# Patient Record
Sex: Male | Born: 1960 | Race: Black or African American | Hispanic: No | Marital: Married | Smoking: Never smoker
Health system: Southern US, Community
[De-identification: ages and names within clinical notes are randomized; demographics above are authoritative.]

## PROBLEM LIST (undated history)

## (undated) ENCOUNTER — Ambulatory Visit: Admission: EM | Payer: PRIVATE HEALTH INSURANCE | Source: Home / Self Care

## (undated) DIAGNOSIS — E119 Type 2 diabetes mellitus without complications: Secondary | ICD-10-CM

## (undated) DIAGNOSIS — I10 Essential (primary) hypertension: Secondary | ICD-10-CM

## (undated) HISTORY — PX: ROTATOR CUFF REPAIR: SHX139

---

## 2008-11-28 ENCOUNTER — Ambulatory Visit: Payer: Self-pay | Admitting: Unknown Physician Specialty

## 2008-12-12 ENCOUNTER — Ambulatory Visit: Payer: Self-pay | Admitting: Unknown Physician Specialty

## 2013-03-01 ENCOUNTER — Ambulatory Visit: Payer: Self-pay | Admitting: Gastroenterology

## 2013-04-16 ENCOUNTER — Ambulatory Visit: Payer: Self-pay | Admitting: Physician Assistant

## 2013-09-25 ENCOUNTER — Emergency Department: Payer: Self-pay | Admitting: Emergency Medicine

## 2013-11-16 ENCOUNTER — Ambulatory Visit: Payer: Self-pay | Admitting: Emergency Medicine

## 2013-11-18 ENCOUNTER — Ambulatory Visit: Payer: Self-pay | Admitting: Emergency Medicine

## 2018-01-28 ENCOUNTER — Other Ambulatory Visit: Payer: Self-pay

## 2018-01-28 ENCOUNTER — Ambulatory Visit
Admission: EM | Admit: 2018-01-28 | Discharge: 2018-01-28 | Disposition: A | Discharge status: Home / Self Care | Payer: BLUE CROSS/BLUE SHIELD | Attending: Family Medicine | Admitting: Family Medicine

## 2018-01-28 ENCOUNTER — Encounter: Payer: Self-pay | Admitting: Emergency Medicine

## 2018-01-28 ENCOUNTER — Ambulatory Visit (INDEPENDENT_AMBULATORY_CARE_PROVIDER_SITE_OTHER): Payer: BLUE CROSS/BLUE SHIELD

## 2018-01-28 DIAGNOSIS — M6740 Ganglion, unspecified site: Secondary | ICD-10-CM | POA: Insufficient documentation

## 2018-01-28 DIAGNOSIS — G5601 Carpal tunnel syndrome, right upper limb: Secondary | ICD-10-CM | POA: Insufficient documentation

## 2018-01-28 DIAGNOSIS — M67441 Ganglion, right hand: Secondary | ICD-10-CM | POA: Diagnosis not present

## 2018-01-28 DIAGNOSIS — G5610 Other lesions of median nerve, unspecified upper limb: Secondary | ICD-10-CM

## 2018-01-28 HISTORY — DX: Type 2 diabetes mellitus without complications: E11.9

## 2018-01-28 HISTORY — DX: Essential (primary) hypertension: I10

## 2018-01-28 MED ORDER — MELOXICAM 15 MG PO TABS: 15.0000 mg | ORAL_TABLET | Freq: Every day | ORAL | 0 refills | Status: DC | PRN

## 2018-01-28 NOTE — Discharge Instructions (Signed)
Get yourself a wrist splint over the counter.  Medication as directed.  If persists, you will need an EMG and possibly an injection or surgery. I recommend seeing [facility].  Take care  Dr. Adriana Simas

## 2018-01-28 NOTE — ED Triage Notes (Signed)
Patient c/o knot in his right hand that started 3 months ago. He is now experiencing numbness and burning sensation in his right hand.

## 2018-01-28 NOTE — ED Provider Notes (Signed)
[facility]    CSN: [id number] Arrival date & time: 01/28/18  0950  History   Chief Complaint Chief Complaint  Patient presents with  . Hand Pain   HPI  58 year old male presents with the above complaint.  Patient reports a 63-month history of pain in his right hand.  Patient notes that he has had a raised area on the middle of his hand.  Firm.  It does not particularly hurt.  He is now experiencing numbness and a burning/tingling sensation of his index and middle finger.  Patient is a Naval architect.  No medications or interventions tried.  No known exacerbating or relieving factors.  No other associated symptoms.  No other complaints.  History reviewed and updated as below.  Past Medical History:  Diagnosis Date  . Diabetes mellitus without complication (HCC)   . Hypertension   HLD  Surgical Hx: Rotator cuff surgery  Right    COLONOSCOPY 03/01/2013  colon (entire examined portion) appeared normal; small nonbleeding intermal hemorrhoids during anoscopy     Home Medications    Prior to Admission medications   Medication Sig Start Date End Date Taking? Authorizing Provider  glipiZIDE (GLUCOTROL XL) 5 MG 24 hr tablet TAKE 1 TABLET BY MOUTH EVERY DAY 10/14/17  Yes [provider]  glucose blood (PRECISION QID TEST) test strip Use 2 (two) times daily 02/04/17  Yes [provider]  lisinopril (PRINIVIL,ZESTRIL) 10 MG tablet Take by mouth.   Yes [provider]  omeprazole (PRILOSEC OTC) 20 MG tablet Take by mouth.   Yes [provider]  pravastatin (PRAVACHOL) 20 MG tablet TAKE 1 TABLET BY MOUTH EVERY DAY AT NIGHT 11/10/17  Yes [provider]  meloxicam (MOBIC) 15 MG tablet Take 1 tablet (15 mg total) by mouth daily as needed. 01/28/18   Tommie Sams, DO   Social History Social History   Tobacco Use  . Smoking status: Never Smoker  . Smokeless tobacco: Never Used  Substance Use Topics  . Alcohol use: Never   Frequency: Never  . Drug use: Never    Allergies   Patient has no known allergies.   Review of Systems Review of Systems  Musculoskeletal:       Hand pain.  Neurological:       Hand paresthesia/burning.   Physical Exam Triage Vital Signs ED Triage Vitals  Enc Vitals Group     BP 01/28/18 1011 (!) 136/93     Pulse Rate 01/28/18 1011 73     Resp 01/28/18 1011 18     Temp 01/28/18 1011 98.2 F (36.8 C)     Temp Source 01/28/18 1011 Oral     SpO2 01/28/18 1011 99 %     Weight 01/28/18 1010 280 lb (127 kg)     Height 01/28/18 1010 6' (1.829 m)     Head Circumference --      Peak Flow --      Pain Score 01/28/18 1010 7     Pain Loc --      Pain Edu? --      Excl. in [place]? --    Updated Vital Signs BP (!) 136/93 (BP Location: Left Arm)   Pulse 73   Temp 98.2 F (36.8 C) (Oral)   Resp 18   Ht 6' (1.829 m)   Wt 127 kg   SpO2 99%   BMI 37.97 kg/m   Visual Acuity Right Eye Distance:   Left Eye Distance:   Bilateral Distance:  Right Eye Near:   Left Eye Near:    Bilateral Near:     Physical Exam Vitals signs and nursing note reviewed.  Constitutional:      General: He is not in acute distress. HENT:     Head: Normocephalic and atraumatic.  Cardiovascular:     Rate and Rhythm: Normal rate and regular rhythm.  Pulmonary:     Effort: Pulmonary effort is normal. No respiratory distress.  Musculoskeletal:     Comments: Right Hand -ganglion cyst noted on the dorsum of the hand.  Negative Tinel's and Phalen's.  However, the distribution of his paresthesia appears consistent with carpal tunnel.  Neurological:     Mental Status: He is alert.  Psychiatric:        Mood and Affect: Mood normal.        Behavior: Behavior normal.    UC Treatments / Results  Labs (all labs ordered are listed, but only abnormal results are displayed) Labs Reviewed - No data to display  EKG None  Radiology Dg Hand Complete Right  Result Date: 01/28/2018 CLINICAL DATA:  Pain and  numbness in right hand. EXAM: RIGHT HAND - COMPLETE 3+ VIEW COMPARISON:  None. FINDINGS: Deformity of the distal aspect of the right 5th proximal phalanx, likely related to old trauma, healed. No acute fracture, subluxation or dislocation. Early joint space narrowing in the 5th PIP joint, likely posttraumatic. Early degenerative changes at the 1st carpometacarpal joint. IMPRESSION: No acute bony abnormality. Probable remote posttraumatic changes in the right 5th proximal phalanx. Electronically Signed   By: Charlett Nose M.D.   On: 01/28/2018 10:28    Procedures Procedures (including critical care time)  Medications Ordered in UC Medications - No data to display  Initial Impression / Assessment and Plan / UC Course  I have reviewed the triage vital signs and the nursing notes.  Pertinent labs & imaging results that were available during my care of the patient were reviewed by me and considered in my medical decision making (see chart for details).    58 year old male presents with a ganglion cyst and a clinical picture consistent with carpal tunnel.  Advised wrist splint.  Mobic as needed.  If fails to improve or worsens, will need to see orthopedics.  Final Clinical Impressions(s) / UC Diagnoses   Final diagnoses:  Carpal tunnel syndrome of right wrist  Ganglion cyst     Discharge Instructions     Get yourself a wrist splint over the counter.  Medication as directed.  If persists, you will need an EMG and possibly an injection or surgery. I recommend seeing [facility].  Take care  Dr. Adriana Simas     ED Prescriptions    Medication Sig Dispense Auth. Provider   meloxicam (MOBIC) 15 MG tablet Take 1 tablet (15 mg total) by mouth daily as needed. 30 tablet Tommie Sams, DO     Controlled Substance Prescriptions [employer] Controlled Substance Registry consulted? Not Applicable   Tommie Sams, DO 01/28/18 1131

## 2018-02-24 ENCOUNTER — Other Ambulatory Visit: Payer: Self-pay | Admitting: Family Medicine

## 2019-04-19 ENCOUNTER — Encounter: Payer: Self-pay | Admitting: Emergency Medicine

## 2019-04-19 ENCOUNTER — Ambulatory Visit
Admission: EM | Admit: 2019-04-19 | Discharge: 2019-04-19 | Disposition: A | Discharge status: Home / Self Care | Payer: BC Managed Care – PPO

## 2019-04-19 ENCOUNTER — Other Ambulatory Visit: Payer: Self-pay

## 2019-04-19 DIAGNOSIS — M25552 Pain in left hip: Secondary | ICD-10-CM | POA: Diagnosis not present

## 2019-04-19 MED ORDER — OMEPRAZOLE MAGNESIUM 20 MG PO TBEC: 20.0000 mg | DELAYED_RELEASE_TABLET | Freq: Every day | ORAL | 0 refills | Status: AC

## 2019-04-19 MED ORDER — MELOXICAM 15 MG PO TABS: 15.0000 mg | ORAL_TABLET | Freq: Every day | ORAL | 0 refills | Status: DC | PRN

## 2019-04-19 NOTE — Discharge Instructions (Signed)
Please start daily meloxicam to help with pain, take with food. May need to take with daily omeprazole as may trigger reflux symptoms.  See exercises provided as well which may help with your symptoms as well.  Ice application at the end of the day.  Regular light activity as tolerated.  Don't sit on your wallet.  Please follow up with your primary care provider for recheck as may need additional management if symptoms continue to persist or progress. Follow up with orthopedics as needed, especially if worsening.

## 2019-04-19 NOTE — ED Provider Notes (Signed)
[facility]    CSN: [id number] Arrival date & time: 04/19/19  0816      History   Chief Complaint Chief Complaint  Patient presents with  . Hip Pain    left    HPI Justin Ortiz is a 59 y.o. male.   Justin Ortiz presents with complaints of left hip pain. Has been progressing over the past 2-3 months. 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 It is painful if he tries to lay on his left hip. Now also has some pain with ambulation. No back pain. Has had issues with his back in the past and required injections, but has been years, and since his back has felt well. No specific injury to back or hip. Does note at times has radiation of pain, burning sensation, down his left leg. No new numbness tingling or weakness. No saddle symptoms. No bowel or bladder incontinence. Has occasionally taken tylenol, advil or applied icy hot, these do seem to help some. Does follow with a PCP. History of T2DM, htn. Has had right rotator cuff surgery in the past.     ROS per HPI, negative if not otherwise mentioned.      Past Medical History:  Diagnosis Date  . Diabetes mellitus without complication (HCC)   . Hypertension     There are no problems to display for this patient.   Past Surgical History:  Procedure Laterality Date  . ROTATOR CUFF REPAIR Right        Home Medications    Prior to Admission medications   Medication Sig Start Date End Date Taking? Authorizing Provider  glipiZIDE (GLUCOTROL XL) 5 MG 24 hr tablet TAKE 1 TABLET BY MOUTH EVERY DAY 10/14/17  Yes [provider]  glucose blood (PRECISION QID TEST) test strip Use 2 (two) times daily 02/04/17  Yes [provider]  lisinopril (PRINIVIL,ZESTRIL) 10 MG tablet Take by mouth.   Yes [provider]  pioglitazone (ACTOS) 15 MG tablet Take 15 mg by mouth daily. 04/13/19  Yes [provider]  pravastatin (PRAVACHOL) 20 MG tablet TAKE 1 TABLET BY  MOUTH EVERY DAY AT NIGHT 11/10/17  Yes [provider]  meloxicam (MOBIC) 15 MG tablet Take 1 tablet (15 mg total) by mouth daily as needed. 04/19/19   Georgetta Haber, NP  omeprazole (PRILOSEC OTC) 20 MG tablet Take 1 tablet (20 mg total) by mouth daily. 04/19/19   Georgetta Haber, NP    Family History History reviewed. No pertinent family history.  Social History Social History   Tobacco Use  . Smoking status: Never Smoker  . Smokeless tobacco: Never Used  Substance Use Topics  . Alcohol use: Never  . Drug use: Never     Allergies   Patient has no known allergies.   Review of Systems Review of Systems   Physical Exam Triage Vital Signs ED Triage Vitals  Enc Vitals Group     BP 04/19/19 0843 118/90     Pulse Rate 04/19/19 0843 79     Resp 04/19/19 0843 18     Temp 04/19/19 0843 98.2 F (36.8 C)     Temp Source 04/19/19 0843 Oral     SpO2 04/19/19 0843 97 %     Weight 04/19/19 0838 280 lb (127 kg)     Height 04/19/19 0838 6' (1.829 m)     Head Circumference --      Peak Flow --  Pain Score 04/19/19 0838 3     Pain Loc --      Pain Edu? --      Excl. in [place]? --    No data found.  Updated Vital Signs BP 118/90 (BP Location: Left Arm)   Pulse 79   Temp 98.2 F (36.8 C) (Oral)   Resp 18   Ht 6' (1.829 m)   Wt 280 lb (127 kg)   SpO2 97%   BMI 37.97 kg/m    Physical Exam Constitutional:      Appearance: He is well-developed.  Cardiovascular:     Rate and Rhythm: Normal rate.  Pulmonary:     Effort: Pulmonary effort is normal.  Musculoskeletal:     Lumbar back: Normal. No tenderness. Negative right straight leg raise test and negative left straight leg raise test.     Left hip: Tenderness and bony tenderness present. No deformity, lacerations or crepitus. Normal range of motion. Normal strength.     Comments: Mild left hip, proximal humerus, tenderness on palpation; mild pain with log roll; no pain with hip flexion, external rotation,  straight leg raise or hip abduction; ambulatory without difficulty; strength equal bilaterally; gross sensation intact to lower extremities   Skin:    General: Skin is warm and dry.  Neurological:     Mental Status: He is alert and oriented to person, place, and time.      UC Treatments / Results  Labs (all labs ordered are listed, but only abnormal results are displayed) Labs Reviewed - No data to display  EKG   Radiology No results found.  Procedures Procedures (including critical care time)  Medications Ordered in UC Medications - No data to display  Initial Impression / Assessment and Plan / UC Course  I have reviewed the triage vital signs and the nursing notes.  Pertinent labs & imaging results that were available during my care of the patient were reviewed by me and considered in my medical decision making (see chart for details).     Progressive left hip pain without injury. Works as a Administrator. Imaging deferred today. Arthritis vs tendonitis considered and discussed. Exercises and stretches provided. Ergonomics discussed. meloxicam provided for daily use to see if any improvement. Follow up with PCP and/or ortho as may need imaging if persists, P/T or further management. Return precautions provided. Patient verbalized understanding and agreeable to plan.  Ambulatory out of clinic without difficulty.    Final Clinical Impressions(s) / UC Diagnoses   Final diagnoses:  Left hip pain     Discharge Instructions     Please start daily meloxicam to help with pain, take with food. May need to take with daily omeprazole as may trigger reflux symptoms.  See exercises provided as well which may help with your symptoms as well.  Ice application at the end of the day.  Regular light activity as tolerated.  Don't sit on your wallet.  Please follow up with your primary care provider for recheck as may need additional management if symptoms continue to persist or  progress. Follow up with orthopedics as needed, especially if worsening.    ED Prescriptions    Medication Sig Dispense Auth. Provider   meloxicam (MOBIC) 15 MG tablet Take 1 tablet (15 mg total) by mouth daily as needed. 30 tablet Augusto Gamble B, NP   omeprazole (PRILOSEC OTC) 20 MG tablet Take 1 tablet (20 mg total) by mouth daily. 30 tablet Zigmund Gottron, NP  PDMP not reviewed this encounter.   Georgetta Haber, NP 04/19/19 [id number]

## 2019-04-19 NOTE — ED Triage Notes (Signed)
Pt c/o left hip pain. Started about 2-3 months ago. He states that the pain radiates down his leg and is a shooting burning pain. 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

## 2019-05-28 ENCOUNTER — Other Ambulatory Visit: Payer: Self-pay

## 2019-05-28 ENCOUNTER — Ambulatory Visit: Payer: BC Managed Care – PPO | Attending: Internal Medicine

## 2019-05-28 DIAGNOSIS — Z23 Encounter for immunization: Secondary | ICD-10-CM

## 2019-05-28 NOTE — Progress Notes (Signed)
   Covid-19 Vaccination Clinic  Name:  Jarmal Lewelling    MRN: [id number] DOB: August 02, 1960  05/28/2019  Mr. Santagata was observed post Covid-19 immunization for 15 minutes without incident. He was provided with Vaccine Information Sheet and instruction to access the V-Safe system.   Mr. Yepiz was instructed to call 911 with any severe reactions post vaccine: Marland Kitchen Difficulty breathing  . Swelling of face and throat  . A fast heartbeat  . A bad rash all over body  . Dizziness and weakness   Immunizations Administered    Name Date Dose VIS Date Route   Pfizer COVID-19 Vaccine 05/28/2019  8:18 AM 0.3 mL 03/17/2018 Intramuscular   Manufacturer: [employer], [employer]   Lot: [id number]   NDC: [address]-5

## 2019-06-22 ENCOUNTER — Ambulatory Visit: Payer: BC Managed Care – PPO

## 2019-06-25 ENCOUNTER — Ambulatory Visit: Payer: BC Managed Care – PPO | Attending: Internal Medicine

## 2019-06-25 DIAGNOSIS — Z23 Encounter for immunization: Secondary | ICD-10-CM

## 2019-06-25 NOTE — Progress Notes (Signed)
   Covid-19 Vaccination Clinic  Name:  Justin Ortiz    MRN: [id number] DOB: [birth year]  06/25/2019  Justin Ortiz was observed post Covid-19 immunization for 15 minutes without incident. He was provided with Vaccine Information Sheet and instruction to access the V-Safe system.   Justin Ortiz was instructed to call 911 with any severe reactions post vaccine: Marland Kitchen Difficulty breathing  . Swelling of face and throat  . A fast heartbeat  . A bad rash all over body  . Dizziness and weakness   Immunizations Administered    Name Date Dose VIS Date Route   Pfizer COVID-19 Vaccine 06/25/2019  8:06 AM 0.3 mL 03/17/2018 Intramuscular   Manufacturer: [employer], [employer]   Lot: [id number]   NDC: [address]-3

## 2019-12-13 ENCOUNTER — Ambulatory Visit (INDEPENDENT_AMBULATORY_CARE_PROVIDER_SITE_OTHER): Payer: BC Managed Care – PPO | Admitting: Internal Medicine

## 2019-12-13 VITALS — BP 116/90 | HR 69 | Ht 72.0 in | Wt 291.0 lb

## 2019-12-13 DIAGNOSIS — Z7189 Other specified counseling: Secondary | ICD-10-CM | POA: Diagnosis not present

## 2019-12-13 DIAGNOSIS — K219 Gastro-esophageal reflux disease without esophagitis: Secondary | ICD-10-CM | POA: Diagnosis not present

## 2019-12-13 DIAGNOSIS — E119 Type 2 diabetes mellitus without complications: Secondary | ICD-10-CM | POA: Insufficient documentation

## 2019-12-13 DIAGNOSIS — E669 Obesity, unspecified: Secondary | ICD-10-CM | POA: Insufficient documentation

## 2019-12-13 DIAGNOSIS — G4733 Obstructive sleep apnea (adult) (pediatric): Secondary | ICD-10-CM | POA: Diagnosis not present

## 2019-12-13 DIAGNOSIS — Z9989 Dependence on other enabling machines and devices: Secondary | ICD-10-CM

## 2019-12-13 DIAGNOSIS — I10 Essential (primary) hypertension: Secondary | ICD-10-CM | POA: Diagnosis not present

## 2019-12-13 NOTE — Progress Notes (Signed)
[facility] [address] [place], [place] [address]  Pulmonary Sleep Medicine   Office Visit Note  Patient Name: Justin Ortiz DOB: 1960-08-21 MRN [id number]    Chief Complaint: Obstructive Sleep Apnea visit  Brief History:  Justin Ortiz is seen today for initial consultation. The patient has a 6 year history of sleep apnea. He has a CDL. Patient is not using PAP nightly.  The patient feels no difference after sleeping with PAP.  The patient reports no benefit from PAP use. Reported sleepiness is  unchange and the Epworth Sleepiness Score is 1 out of 24. The patient does take naps. The patient complains of the following: dryness.  The compliance download shows fair compliance with an average use time of 6.1 hours. The AHI is 1.7  The patient does not of limb movements disrupting sleep.  ROS  General: (-) fever, (-) chills, (-) night sweat Nose and Sinuses: (-) nasal stuffiness or itchiness, (-) postnasal drip, (-) nosebleeds, (-) sinus trouble. Mouth and Throat: (-) sore throat, (-) hoarseness. Neck: (-) swollen glands, (-) enlarged thyroid, (-) neck pain. Respiratory: - cough, - shortness of breath, - wheezing. Neurologic: - numbness, - tingling. Psychiatric: - anxiety, - depression   Current Medication: Outpatient Encounter Medications as of 12/13/2019  Medication Sig  . glipiZIDE (GLUCOTROL XL) 5 MG 24 hr tablet TAKE 1 TABLET BY MOUTH EVERY DAY  . glucose blood (PRECISION QID TEST) test strip Use 2 (two) times daily  . lisinopril (PRINIVIL,ZESTRIL) 10 MG tablet Take by mouth.  . meloxicam (MOBIC) 15 MG tablet Take 1 tablet (15 mg total) by mouth daily as needed.  Marland Kitchen omeprazole (PRILOSEC OTC) 20 MG tablet Take 1 tablet (20 mg total) by mouth daily.  . pioglitazone (ACTOS) 15 MG tablet Take 15 mg by mouth daily.  . pravastatin (PRAVACHOL) 20 MG tablet TAKE 1 TABLET BY MOUTH EVERY DAY AT NIGHT   No facility-administered encounter medications on file as of  12/13/2019.    Surgical History: Past Surgical History:  Procedure Laterality Date  . ROTATOR CUFF REPAIR Right     Medical History: Past Medical History:  Diagnosis Date  . Diabetes mellitus without complication (HCC)   . Hypertension     Family History: Non contributory to the present illness  Social History: Social History   Socioeconomic History  . Marital status: Married    Spouse name: Not on file  . Number of children: Not on file  . Years of education: Not on file  . Highest education level: Not on file  Occupational History  . Not on file  Tobacco Use  . Smoking status: Never Smoker  . Smokeless tobacco: Never Used  Vaping Use  . Vaping Use: Never used  Substance and Sexual Activity  . Alcohol use: Never  . Drug use: Never  . Sexual activity: Not on file  Other Topics Concern  . Not on file  Social History Narrative  . Not on file   Social Determinants of Health   Financial Resource Strain:   . Difficulty of Paying Living Expenses: Not on file  Food Insecurity:   . Worried About Programme researcher, broadcasting/film/video in the Last Year: Not on file  . Ran Out of Food in the Last Year: Not on file  Transportation Needs:   . Lack of Transportation (Medical): Not on file  . Lack of Transportation (Non-Medical): Not on file  Physical Activity:   . Days of Exercise per Week: Not on file  .  Minutes of Exercise per Session: Not on file  Stress:   . Feeling of Stress : Not on file  Social Connections:   . Frequency of Communication with Friends and Family: Not on file  . Frequency of Social Gatherings with Friends and Family: Not on file  . Attends Religious Services: Not on file  . Active Member of Clubs or Organizations: Not on file  . Attends Banker Meetings: Not on file  . Marital Status: Not on file  Intimate Partner Violence:   . Fear of Current or Ex-Partner: Not on file  . Emotionally Abused: Not on file  . Physically Abused: Not on file  .  Sexually Abused: Not on file    Vital Signs: There were no vitals taken for this visit.  Examination: General Appearance: The patient is well-developed, well-nourished, and in no distress. Neck Circumference: 43 Skin: Gross inspection of skin unremarkable. Head: normocephalic, no gross deformities. Eyes: no gross deformities noted. ENT: ears appear grossly normal Neurologic: Alert and oriented. No involuntary movements.    EPWORTH SLEEPINESS SCALE:  Scale:  (0)= no chance of dozing; (1)= slight chance of dozing; (2)= moderate chance of dozing; (3)= high chance of dozing  Chance  Situtation    Sitting and reading: 0    Watching TV: 1    Sitting Inactive in public: 0    As a passenger in car: 0      Lying down to rest: 0    Sitting and talking: 0    Sitting quielty after lunch: 0    In a car, stopped in traffic: 0   TOTAL SCORE:   1 out of 24    SLEEP STUDIES:  1. PSG 12-07-13 AHI 53 SpO109min 83%   CPAP COMPLIANCE DATA:  Date Range: 12/08/18-12/07/19  Average Daily Use: 6.1 hours  Median Use: 6.2  Compliance for > 4 Hours: 70%  AHI: 1.7 respiratory events per hour  Days Used: 277/365  Mask Leak: 15.5  95th Percentile Pressure: 9         LABS: No results found for this or any previous visit (from the past 2160 hour(s)).  Radiology: No results found.  No results found.  No results found.    Assessment and Plan: There are no problems to display for this patient.     The patient does tolerate PAPmoderately well but often does not use the mask. He  reports no benefit from PAP use. The patient was reminded how to adjust the humidifier setting and advised to use the CPAP nightly. The morbidity associated with untreated sleep apnea was discussed. The patient was also counselled on the importance of regular exercise and a good diet. The compliance is fair. The apnea is well controlled.   1. OSA-use CPAP 2.CPAP couseling-Discussed  importance of adequate CPAP use as well as proper care and cleaning techniques of machine and all supplies. 3. Obesity Class II: Discussed the importance of weight management through healthy eating and daily exercise as tolerated. Discussed the negative effects obesity has on pulmonary health, cardiac health as well as overall general health and well being. 4. GERD: controlled on omeprazole. Followed by PCP.  5. HTN: BP controlled on Lisinopril. Followed by PCP. Pt states he was put on lisinopril for 'preventative measures'.        General Counseling: I have discussed the findings of the evaluation and examination with [place].  I have also discussed any further diagnostic evaluation thatmay be needed or ordered today.  Camil verbalizes understanding of the findings of todays visit. We also reviewed his medications today and discussed drug interactions and side effects including but not limited excessive drowsiness and altered mental states. We also discussed that there is always a risk not just to him but also people around him. he has been encouraged to call the office with any questions or concerns that should arise related to todays visit.  No orders of the defined types were placed in this encounter.   This patient was seen by Layla Barter, AGNP-C in collaboration with Dr. Freda Munro as a part of collaborative care agreement.     I have personally obtained a history, examined the patient, evaluated laboratory and imaging results, formulated the assessment and plan and placed orders.   Valentino Hue Sol Blazing, PhD, FAASM  Diplomate, American Board of Sleep Medicine    Yevonne Pax, MD [facility] Diplomate ABMS Pulmonary and Critical Care Medicine Sleep medicine

## 2019-12-13 NOTE — Patient Instructions (Signed)

## 2020-10-24 IMAGING — CR DG HAND COMPLETE 3+V*R*
3 series · 3 of 3 positions shown · non-contrast
Comparison: None.

CLINICAL DATA: Pain and numbness in right hand.

EXAM:
RIGHT HAND - COMPLETE 3+ VIEW

[hand ap]
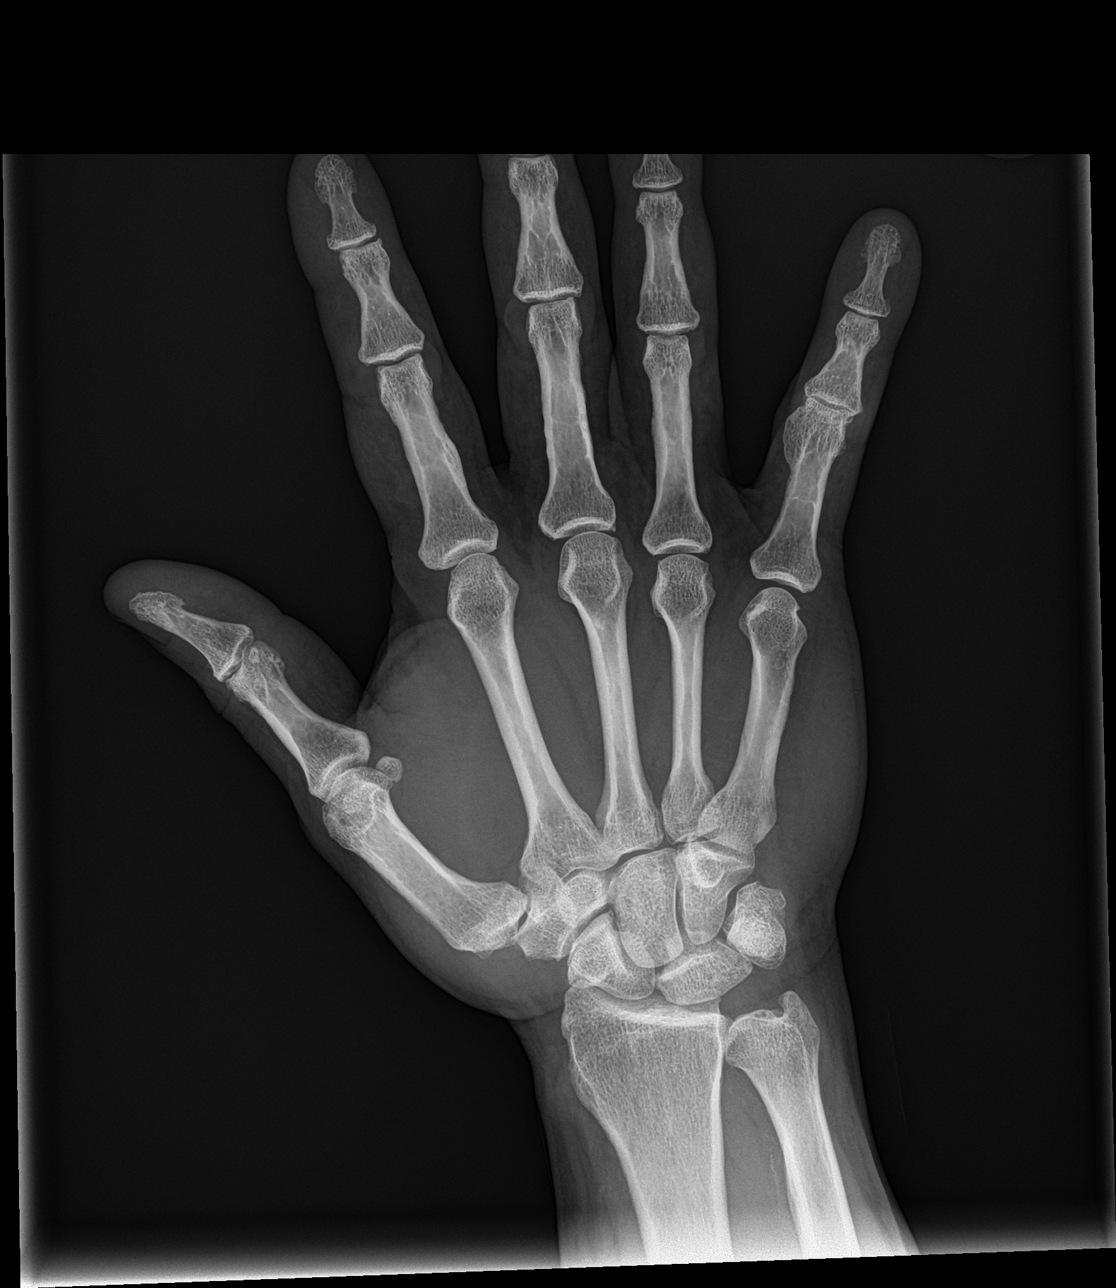

[hand obl]
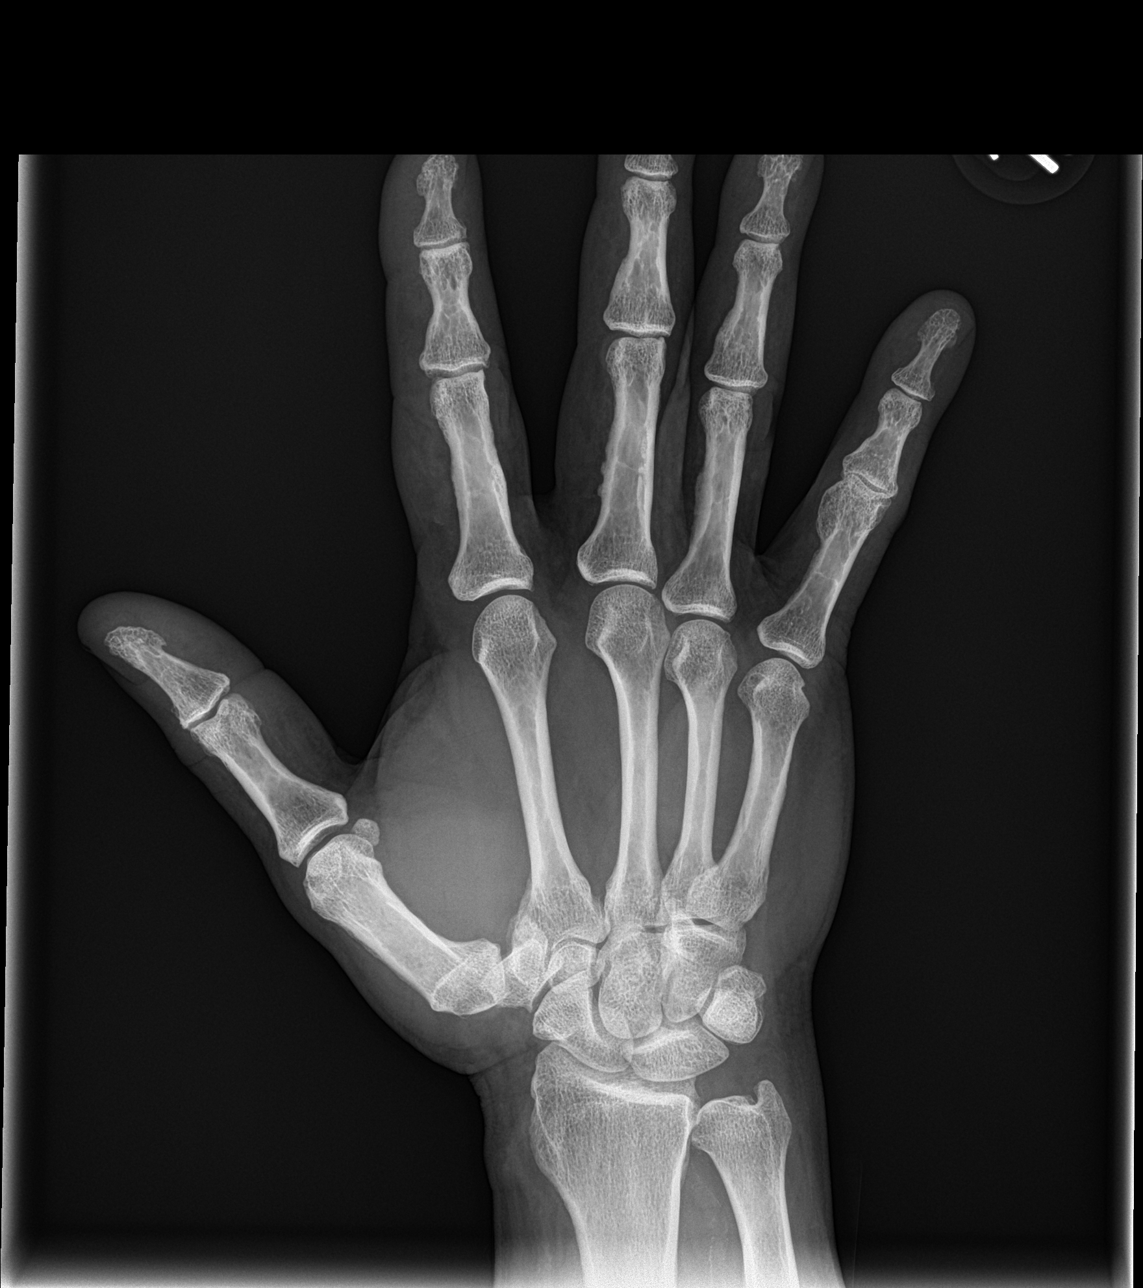

[hand lat]
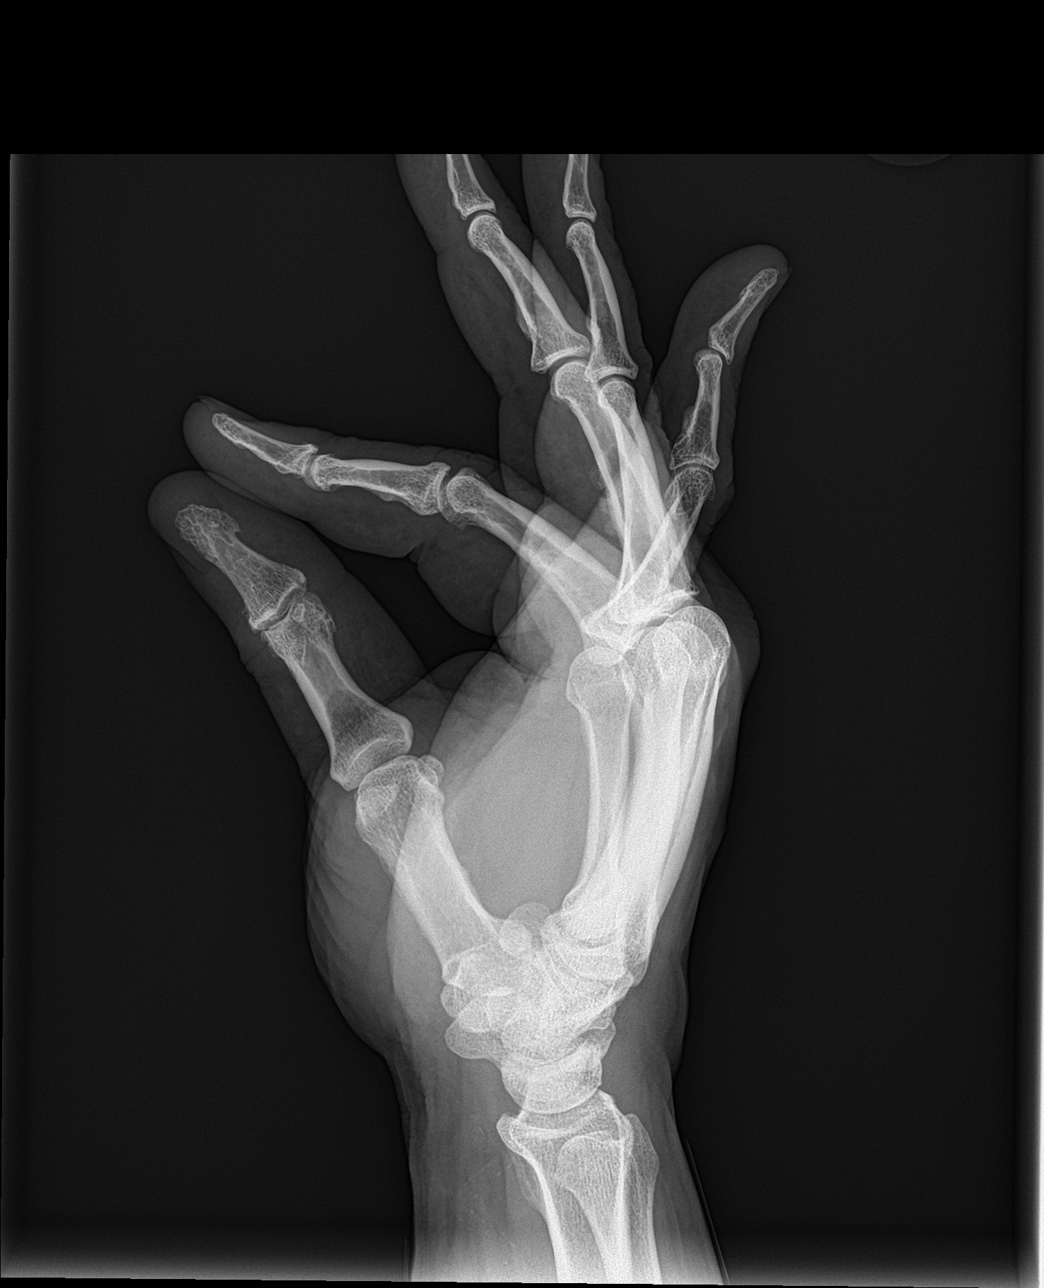

[3 of 3 positions shown; findings below may reference images not displayed]

FINDINGS: Deformity of the distal aspect of the right 5th proximal phalanx,
likely related to old trauma, healed. No acute fracture, subluxation
or dislocation. Early joint space narrowing in the 5th PIP joint,
likely posttraumatic. Early degenerative changes at the 1st
carpometacarpal joint.
IMPRESSION: No acute bony abnormality. Probable remote posttraumatic changes in
the right 5th proximal phalanx.

## 2022-06-10 ENCOUNTER — Ambulatory Visit
Admission: EM | Admit: 2022-06-10 | Discharge: 2022-06-10 | Disposition: A | Discharge status: Home / Self Care | Payer: PRIVATE HEALTH INSURANCE | Attending: Internal Medicine | Admitting: Internal Medicine

## 2022-06-10 ENCOUNTER — Encounter: Payer: Self-pay | Admitting: Emergency Medicine

## 2022-06-10 DIAGNOSIS — R350 Frequency of micturition: Secondary | ICD-10-CM | POA: Diagnosis present

## 2022-06-10 DIAGNOSIS — M5489 Other dorsalgia: Secondary | ICD-10-CM

## 2022-06-10 LAB — URINALYSIS, ROUTINE W REFLEX MICROSCOPIC
Bilirubin Urine: NEGATIVE
Glucose, UA: NEGATIVE mg/dL
Hgb urine dipstick: NEGATIVE
Ketones, ur: NEGATIVE mg/dL
Leukocytes,Ua: NEGATIVE
Nitrite: NEGATIVE
Protein, ur: NEGATIVE mg/dL
Specific Gravity, Urine: 1.025 (ref 1.005–1.030)
pH: 5.5 (ref 5.0–8.0)

## 2022-06-10 MED ORDER — MELOXICAM 7.5 MG PO TABS: 7.5000 mg | ORAL_TABLET | Freq: Two times a day (BID) | ORAL | 0 refills | Status: AC

## 2022-06-10 NOTE — ED Provider Notes (Signed)
[facility]    CSN: [id number] Arrival date & time: 06/10/22  1009      History   Chief Complaint Chief Complaint  Patient presents with   Back Pain    HPI Justin Ortiz is a 62 y.o. male who presents with mid back pain off and on x 2 weeks. Pain is provoked with bending over and when he lays down. 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  Has had steroid lumbar injections a few years ago, and helped with bulging disc problem he had.  States when he leans back on the recliner, he feels a fullness on his lower back, " like I am leaning against a inflated balloon".     Past Medical History:  Diagnosis Date   Diabetes mellitus without complication (HCC)    Hypertension     Patient Active Problem List   Diagnosis Date Noted   GERD (gastroesophageal reflux disease) 12/13/2019   Hypertension 12/13/2019   Obesity (BMI 30-39.9) 12/13/2019   Diabetes mellitus type 2, uncomplicated (HCC) 12/13/2019   OSA on CPAP 12/13/2019   CPAP use counseling 12/13/2019    Past Surgical History:  Procedure Laterality Date   ROTATOR CUFF REPAIR Right        Home Medications    Prior to Admission medications   Medication Sig Start Date End Date Taking? Authorizing Provider  meloxicam (MOBIC) 7.5 MG tablet Take 1 tablet (7.5 mg total) by mouth 2 (two) times daily. 06/10/22  Yes Rodriguez-Southworth, Nettie Elm, PA-C  glipiZIDE (GLUCOTROL XL) 10 MG 24 hr tablet Take by mouth. 10/07/19 10/06/20  [provider]  glipiZIDE (GLUCOTROL XL) 5 MG 24 hr tablet TAKE 1 TABLET BY MOUTH EVERY DAY 10/14/17   [provider]  glucose blood (PRECISION QID TEST) test strip Use 2 (two) times daily 02/04/17   [provider]  lisinopril (PRINIVIL,ZESTRIL) 10 MG tablet Take by mouth.    [provider]  lisinopril (ZESTRIL)  2.5 MG tablet Take 1 tablet by mouth daily. 11/08/19   [provider]  omeprazole (PRILOSEC OTC) 20 MG tablet Take 1 tablet (20 mg total) by mouth daily. 04/19/19   Linus Mako B, NP  pioglitazone (ACTOS) 15 MG tablet Take 15 mg by mouth daily. 04/13/19   [provider]  pravastatin (PRAVACHOL) 20 MG tablet TAKE 1 TABLET BY MOUTH EVERY DAY AT NIGHT 11/10/17   [provider]    Family History No family history on file.  Social History Social History   Tobacco Use   Smoking status: Never   Smokeless tobacco: Never  Vaping Use   Vaping Use: Never used  Substance Use Topics   Alcohol use: Never   Drug use: Never     Allergies   Metformin   Review of Systems Review of Systems  Endocrine:       Low sex drive   And As noted in HPI  Physical Exam Triage Vital Signs ED Triage Vitals  Enc Vitals Group     BP 06/10/22 1026 (!) 145/100     Pulse Rate 06/10/22 1026 70     Resp 06/10/22 1026 18     Temp 06/10/22 1026 97.9 F (36.6 C)     Temp Source 06/10/22 1026 Oral     SpO2 06/10/22 1026 96 %     Weight --  Height --      Head Circumference --      Peak Flow --      Pain Score 06/10/22 1024 6     Pain Loc --      Pain Edu? --      Excl. in GC? --    No data found.  Updated Vital Signs BP (!) 145/100 (BP Location: Right Arm)   Pulse 70   Temp 97.9 F (36.6 C) (Oral)   Resp 18   SpO2 96%   Visual Acuity Right Eye Distance:   Left Eye Distance:   Bilateral Distance:    Right Eye Near:   Left Eye Near:    Bilateral Near:     Physical Exam Vitals and nursing note reviewed.  Constitutional:      Appearance: He is obese. He is not ill-appearing or diaphoretic.  HENT:     Right Ear: External ear normal.     Left Ear: External ear normal.  Eyes:     General: No scleral icterus.    Conjunctiva/sclera: Conjunctivae normal.  Pulmonary:     Effort: Pulmonary effort is normal.  Abdominal:     Tenderness: There is no right  CVA tenderness or left CVA tenderness.  Musculoskeletal:        General: Normal range of motion.     Comments: BACK- no masses or deformities with inspection, has normal ROM, has local tenterness on L mid lumbar muscular region with deep palpation. Neg SLR  Skin:    General: Skin is warm and dry.     Findings: No bruising or rash.  Neurological:     Mental Status: He is alert and oriented to person, place, and time.     Gait: Gait normal.     Deep Tendon Reflexes: Reflexes normal.  Psychiatric:        Mood and Affect: Mood normal.        Behavior: Behavior normal.        Thought Content: Thought content normal.        Judgment: Judgment normal.      UC Treatments / Results  Labs (all labs ordered are listed, but only abnormal results are displayed) Labs Reviewed  URINALYSIS, ROUTINE W REFLEX MICROSCOPIC    EKG   Radiology No results found.  Procedures Procedures (including critical care time)  Medications Ordered in UC Medications - No data to display  Initial Impression / Assessment and Plan / UC Course  I have reviewed the triage vital signs and the nursing notes.  Pertinent labs  results that were available during my care of the patient were reviewed by me and considered in my medical decision making (see chart for details).  Mid lumbar back pain Urinary frequency which could be BPH  I placed him on Mobic as noted. Advised to FU with PCP as noted in instructions.    Final Clinical Impressions(s) / UC Diagnoses   Final diagnoses:  Back pain without sciatica     Discharge Instructions      Follow up with your primary care doctor about your back and urinary frequency. Your prostate may need to back checked.  You may also want to consider seeing a chiropractor      ED Prescriptions     Medication Sig Dispense Auth. Provider   meloxicam (MOBIC) 7.5 MG tablet Take 1 tablet (7.5 mg total) by mouth 2 (two) times daily. 14 tablet Rodriguez-Southworth,  Nettie Elm, [place]      PDMP  not reviewed this encounter.   Garey Ham, PA-C 06/10/22 1134

## 2022-06-10 NOTE — Discharge Instructions (Addendum)
Follow up with your primary care doctor about your back and urinary frequency. Your prostate may need to back checked.  You may also want to consider seeing a chiropractor

## 2022-06-10 NOTE — ED Triage Notes (Signed)
Pt presents with lower back pain off and on x 2 weeks. Pt states the pain is worse when he bends over or lays down.

## 2023-04-21 ENCOUNTER — Ambulatory Visit
Admission: EM | Admit: 2023-04-21 | Discharge: 2023-04-21 | Disposition: A | Discharge status: Home / Self Care | Payer: PRIVATE HEALTH INSURANCE | Attending: Emergency Medicine | Admitting: Emergency Medicine

## 2023-04-21 DIAGNOSIS — N410 Acute prostatitis: Secondary | ICD-10-CM

## 2023-04-21 DIAGNOSIS — G8929 Other chronic pain: Secondary | ICD-10-CM

## 2023-04-21 DIAGNOSIS — M5441 Lumbago with sciatica, right side: Secondary | ICD-10-CM | POA: Diagnosis not present

## 2023-04-21 MED ORDER — SULFAMETHOXAZOLE-TRIMETHOPRIM 800-160 MG PO TABS: 1.0000 | ORAL_TABLET | Freq: Two times a day (BID) | ORAL | 0 refills | Status: AC

## 2023-04-21 MED ORDER — BACLOFEN 10 MG PO TABS: 10.0000 mg | ORAL_TABLET | Freq: Three times a day (TID) | ORAL | 0 refills | Status: DC

## 2023-04-21 MED ORDER — IBUPROFEN 600 MG PO TABS: 600.0000 mg | ORAL_TABLET | Freq: Four times a day (QID) | ORAL | 0 refills | Status: AC | PRN

## 2023-04-21 MED ORDER — TAMSULOSIN HCL 0.4 MG PO CAPS: 0.4000 mg | ORAL_CAPSULE | Freq: Every day | ORAL | 2 refills | Status: DC

## 2023-04-21 NOTE — ED Triage Notes (Signed)
 Patient presents to [facility] for intermittent lower back pain x 1 year. Worsened yesterday. Took advil last night. States no pain today. He is a Naval architect.

## 2023-04-21 NOTE — Discharge Instructions (Addendum)
 Take the Bactrim DS twice daily with a full glass of water for 14 days for treatment of presumptive prostatitis given that you are having difficulty with his urinary stream and your prostate was tender to palpation.  You also may be experiencing a large prostate swelling to start the tamsulosin.  Take that at night before you go to bed.  You will take it once daily.  Make sure that you are drinking plenty of water to help maintain hydration and urine production.  Take the ibuprofen, 600 mg every 6 hours with food, on a schedule for the next 48 hours and then as needed.  Take the baclofen, 10 mg every 8 hours, on a schedule for the next 48 hours and then as needed.  Apply moist heat to your back for 30 minutes at a time 2-3 times a day to improve blood flow to the area and help remove the lactic acid causing the spasm.  Follow the back exercises given at discharge.  Return for reevaluation for any new or worsening symptoms.

## 2023-04-21 NOTE — ED Provider Notes (Signed)
 [facility]    CSN: [id number] Arrival date & time: 04/21/23  0900      History   Chief Complaint Chief Complaint  Patient presents with   Back Pain    HPI Justin Ortiz is a 63 y.o. male.   HPI  63 year old male with past medical history significant for obesity, hypertension, diabetes, and GERD presents for evaluation of low back pain.  He reports that he has been stressing pain in his low back for over a year and it worsened last night.  He reports that he lay down to sleep or When he felt like he was lying on 2 tennis balls.  The pain does increase with movement.  He is a Naval architect.  Additionally, the patient reports that he has been having difficulty starting his urine stream and that his stream is weaker.  He also feels like he is not completely emptying his bladder.  He denies any pain with urination but he does endorse increased nocturia.  Fevers.  Past Medical History:  Diagnosis Date   Diabetes mellitus without complication (HCC)    Hypertension     Patient Active Problem List   Diagnosis Date Noted   GERD (gastroesophageal reflux disease) 12/13/2019   Hypertension 12/13/2019   Obesity (BMI 30-39.9) 12/13/2019   Diabetes mellitus type 2, uncomplicated (HCC) 12/13/2019   OSA on CPAP 12/13/2019   CPAP use counseling 12/13/2019    Past Surgical History:  Procedure Laterality Date   ROTATOR CUFF REPAIR Right        Home Medications    Prior to Admission medications   Medication Sig Start Date End Date Taking? Authorizing Provider  baclofen (LIORESAL) 10 MG tablet Take 1 tablet (10 mg total) by mouth 3 (three) times daily. 04/21/23  Yes Becky Augusta, NP  ibuprofen (ADVIL) 600 MG tablet Take 1 tablet (600 mg total) by mouth every 6 (six) hours as needed. 04/21/23  Yes Becky Augusta, NP  sulfamethoxazole-trimethoprim (BACTRIM DS) 800-160 MG tablet Take 1 tablet by mouth 2 (two) times daily for 14 days. 04/21/23 05/05/23 Yes Becky Augusta, NP   tamsulosin (FLOMAX) 0.4 MG CAPS capsule Take 1 capsule (0.4 mg total) by mouth daily. 04/21/23  Yes Becky Augusta, NP  glipiZIDE (GLUCOTROL XL) 10 MG 24 hr tablet Take by mouth. 10/07/19 10/06/20  [provider]  glipiZIDE (GLUCOTROL XL) 5 MG 24 hr tablet TAKE 1 TABLET BY MOUTH EVERY DAY 10/14/17   [provider]  glucose blood (PRECISION QID TEST) test strip Use 2 (two) times daily 02/04/17   [provider]  lisinopril (PRINIVIL,ZESTRIL) 10 MG tablet Take by mouth.    [provider]  lisinopril (ZESTRIL) 2.5 MG tablet Take 1 tablet by mouth daily. 11/08/19   [provider]  meloxicam (MOBIC) 7.5 MG tablet Take 1 tablet (7.5 mg total) by mouth 2 (two) times daily. 06/10/22   Rodriguez-Southworth, Nettie Elm, PA-C  omeprazole (PRILOSEC OTC) 20 MG tablet Take 1 tablet (20 mg total) by mouth daily. 04/19/19   Linus Mako B, NP  pioglitazone (ACTOS) 15 MG tablet Take 15 mg by mouth daily. 04/13/19   [provider]  pravastatin (PRAVACHOL) 20 MG tablet TAKE 1 TABLET BY MOUTH EVERY DAY AT NIGHT 11/10/17   [provider]    Family History History reviewed. No pertinent family history.  Social History Social History   Tobacco Use   Smoking status: Never   Smokeless tobacco: Never  Vaping Use   Vaping status:  Never Used  Substance Use Topics   Alcohol use: Never   Drug use: Never     Allergies   Metformin   Review of Systems Review of Systems  Constitutional:  Negative for fever.  Genitourinary:  Negative for dysuria, frequency, hematuria and urgency.       Difficulty starting urinary stream.  Feels like he does not completely empty his bladder.  Increased nocturia.  Musculoskeletal:  Positive for back pain.     Physical Exam Triage Vital Signs ED Triage Vitals [04/21/23 0927]  Encounter Vitals Group     BP (!) 143/76     Systolic BP Percentile      Diastolic BP Percentile      Pulse Rate 68     Resp 16     Temp  98.2 F (36.8 C)     Temp Source Oral     SpO2 95 %     Weight      Height      Head Circumference      Peak Flow      Pain Score 0     Pain Loc      Pain Education      Exclude from Growth Chart    No data found.  Updated Vital Signs BP (!) 143/76 (BP Location: Left Arm)   Pulse 68   Temp 98.2 F (36.8 C) (Oral)   Resp 16   SpO2 95%   Visual Acuity Right Eye Distance:   Left Eye Distance:   Bilateral Distance:    Right Eye Near:   Left Eye Near:    Bilateral Near:     Physical Exam Vitals reviewed.  Constitutional:      Appearance: Normal appearance.  Genitourinary:    Comments: Prostate is high riding with mild tenderness to palpation. Musculoskeletal:        General: No tenderness or signs of injury.  Skin:    General: Skin is warm and dry.     Capillary Refill: Capillary refill takes less than 2 seconds.  Neurological:     General: No focal deficit present.     Mental Status: He is alert and oriented to person, place, and time.      UC Treatments / Results  Labs (all labs ordered are listed, but only abnormal results are displayed) Labs Reviewed - No data to display  EKG   Radiology No results found.  Procedures Procedures (including critical care time)  Medications Ordered in UC Medications - No data to display  Initial Impression / Assessment and Plan / UC Course  I have reviewed the triage vital signs and the nursing notes.  Pertinent labs & imaging results that were available during my care of the patient were reviewed by me and considered in my medical decision making (see chart for details).   Patient is a pleasant, nontoxic-appearing 63 year old male presented for evaluation of low back pain and urinary difficulties as outlined HPI above.  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  He does not remember anything that might have worsened the pain.  Has been  ongoing intermittently for the last year.  On exam he has no midline spinous process tenderness or step-off in his lumbar spine.  Also no pain or tenderness with palpation of the bilateral lumbar paraspinous regions.  Pain does not increase with lateral rotation or forward flexion.  He reports  that yesterday both of those activities did increase the pain.  He does have a mildly positive straight leg raise on the right side.  I suspect he has some low back pain as result of his weight as well as being a truck driver.  Additionally, he reports that he has been experiencing difficulty starting his urinary stream over the last year.  He states that he goes to urinate and feels like he cannot completely urinate and then he will have the urge to urinate again a few minutes later.  He also reports an increase in nocturia.  He denies any dysuria, urgency, or frequency.  His DRE does reveal a high riding prostate with mild tenderness.  I am unable to palpate the full body of the prostate to determine hypertrophy or any abnormal texture.  I will start the patient on tamsulosin given his cluster of symptoms and refer him to urology for further evaluation.  I will start him on a 14-day course of Bactrim DS as well given that his back pain may be result of acute prostatitis given the tenderness to his prostate.  With regards to the back pain, patient has been taking ibuprofen which has alleviated some of his symptoms.  I will have him continue 6 and milligrams ibuprofen every 6 hours to help with pain and inflammation.  I will also start him on baclofen every 8 hours help with muscle spasm.  Home physical therapy exercises prescribed.  Due to the fact that the patient is on a sulfonylurea and fluoroquinolones increase the hypoglycemic effects I will change therapy to Bactrim but he has twice daily for 14 days versus Cipro.   Final Clinical Impressions(s) / UC Diagnoses   Final diagnoses:  Chronic bilateral low back pain  with right-sided sciatica  Acute prostatitis     Discharge Instructions      Take the Bactrim DS twice daily with a full glass of water for 14 days for treatment of presumptive prostatitis given that you are having difficulty with his urinary stream and your prostate was tender to palpation.  You also may be experiencing a large prostate swelling to start the tamsulosin.  Take that at night before you go to bed.  You will take it once daily.  Make sure that you are drinking plenty of water to help maintain hydration and urine production.  Take the ibuprofen, 600 mg every 6 hours with food, on a schedule for the next 48 hours and then as needed.  Take the baclofen, 10 mg every 8 hours, on a schedule for the next 48 hours and then as needed.  Apply moist heat to your back for 30 minutes at a time 2-3 times a day to improve blood flow to the area and help remove the lactic acid causing the spasm.  Follow the back exercises given at discharge.  Return for reevaluation for any new or worsening symptoms.      ED Prescriptions     Medication Sig Dispense Auth. Provider   baclofen (LIORESAL) 10 MG tablet Take 1 tablet (10 mg total) by mouth 3 (three) times daily. 30 each Becky Augusta, NP   tamsulosin (FLOMAX) 0.4 MG CAPS capsule Take 1 capsule (0.4 mg total) by mouth daily. 30 capsule Becky Augusta, NP   ibuprofen (ADVIL) 600 MG tablet Take 1 tablet (600 mg total) by mouth every 6 (six) hours as needed. 30 tablet Becky Augusta, NP   sulfamethoxazole-trimethoprim (BACTRIM DS) 800-160 MG tablet Take 1 tablet by  mouth 2 (two) times daily for 14 days. 28 tablet Becky Augusta, NP      PDMP not reviewed this encounter.   Becky Augusta, NP 04/21/23 [phone]

## 2023-05-13 ENCOUNTER — Other Ambulatory Visit: Payer: Self-pay

## 2023-05-13 DIAGNOSIS — N401 Enlarged prostate with lower urinary tract symptoms: Secondary | ICD-10-CM

## 2023-05-14 ENCOUNTER — Ambulatory Visit (INDEPENDENT_AMBULATORY_CARE_PROVIDER_SITE_OTHER): Payer: PRIVATE HEALTH INSURANCE | Admitting: Urology

## 2023-05-14 ENCOUNTER — Other Ambulatory Visit
Admission: RE | Admit: 2023-05-14 | Discharge: 2023-05-14 | Disposition: A | Discharge status: Home / Self Care | Payer: PRIVATE HEALTH INSURANCE | Attending: Urology | Admitting: Urology

## 2023-05-14 VITALS — BP 153/92 | HR 71 | Ht 72.0 in | Wt 312.6 lb

## 2023-05-14 DIAGNOSIS — N401 Enlarged prostate with lower urinary tract symptoms: Secondary | ICD-10-CM | POA: Diagnosis not present

## 2023-05-14 DIAGNOSIS — R3912 Poor urinary stream: Secondary | ICD-10-CM | POA: Diagnosis present

## 2023-05-14 DIAGNOSIS — N3281 Overactive bladder: Secondary | ICD-10-CM | POA: Diagnosis not present

## 2023-05-14 DIAGNOSIS — Z125 Encounter for screening for malignant neoplasm of prostate: Secondary | ICD-10-CM | POA: Diagnosis not present

## 2023-05-14 DIAGNOSIS — N529 Male erectile dysfunction, unspecified: Secondary | ICD-10-CM

## 2023-05-14 LAB — URINALYSIS, COMPLETE (UACMP) WITH MICROSCOPIC
Bilirubin Urine: NEGATIVE
Glucose, UA: NEGATIVE mg/dL
Hgb urine dipstick: NEGATIVE
Ketones, ur: NEGATIVE mg/dL
Leukocytes,Ua: NEGATIVE
Nitrite: NEGATIVE
Protein, ur: 30 mg/dL — AB
RBC / HPF: NONE SEEN RBC/hpf (ref 0–5)
Specific Gravity, Urine: >1.03 — ABNORMAL HIGH (ref 1.005–1.030)
Squamous Epithelial / HPF: NONE SEEN /HPF (ref 0–5)
pH: 6 (ref 5.0–8.0)

## 2023-05-14 LAB — BLADDER SCAN AMB NON-IMAGING

## 2023-05-14 MED ORDER — TADALAFIL 5 MG PO TABS: 5.0000 mg | ORAL_TABLET | Freq: Every day | ORAL | 11 refills | Status: AC

## 2023-05-14 NOTE — Progress Notes (Signed)
   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  He also reports some weak urinary stream.  He drinks primarily water during the day.  He has nocturia 1 time overnight.  He was recently seen in urgent care for low back pain and the urinary symptoms and he was treated with Bactrim  for possible prostatitis and started on Flomax .  It sounds like he had lower extremity swelling with the Bactrim , he denied any change in the urination with the Bactrim  and the Flomax .  He is a challenging historian.  Urinalysis today benign, PVR normal at 40ml.  IPSS score today 8.  PSA was normal at 2.78 from June 2023  He also reports about a year of problems with erections, has never tried medications for this.   PMH: Past Medical History:  Diagnosis Date   Diabetes mellitus without complication (HCC)    Hypertension     Surgical History: Past Surgical History:  Procedure Laterality Date   ROTATOR CUFF REPAIR Right     Family History: No family history on file.  Social History:  reports that he has never smoked. He has never used smokeless tobacco. He reports that he does not drink alcohol and does not use drugs.  Physical Exam: BP (!) 153/92 (BP Location: Left Arm, Patient Position: Sitting, Cuff Size: Large)   Pulse 71   Ht 6' (1.829 m)   Wt (!) 312 lb 9.6 oz (141.8 kg)   SpO2 95%   BMI 42.40 kg/m    Constitutional:  Alert and oriented, No acute distress. Cardiovascular: No clubbing, cyanosis, or edema. Respiratory: Normal respiratory effort, no increased work of breathing. GI: Abdomen is soft, nontender, nondistended, no abdominal masses   Laboratory Data: Reviewed, see  HPI  Assessment & Plan:   63 year old male with a number of symptoms including urinary frequency and urgency, low back pain that has improved, and ED.  Was treated with Bactrim  for possible prostatitis, but urinalysis have been benign, and he denied any improvement after taking Bactrim .  He was also prescribed Flomax , but unclear if he ever actually took that medication, does not sound like he saw any improvement in the urination at that time.  Reassurance was provided today regarding his benign urinalysis, normal PVR, normal PSA value.  I recommended starting with basic strategies of avoiding bladder irritants, improve diabetes control, and weight loss.  We discussed considering an OAB medication in the future if persistent symptoms.  In terms of his ED, I recommended a trial of Cialis  5 mg daily.  This may also improve some of his urinary symptoms, and risks and benefits were discussed.  Could take an additional 5 to 15 mg as needed 1 hour prior to sexual activity  Trial of Cialis  5 mg daily with boost dose as needed RTC 6 weeks PVR and symptom check  Jay Meth, MD 05/14/2023  [facility] [address], [address] [place], [place] [address] [phone]

## 2023-05-14 NOTE — Patient Instructions (Signed)
Tadalafil Tablets (Erectile Dysfunction, BPH) What is this medication? TADALAFIL (tah DA la fil) treats erectile dysfunction (ED). It works by increasing blood flow to the penis, which helps to maintain an erection. It may also be used to treat symptoms of an enlarged prostate (benign prostatic hyperplasia). This medicine may be used for other purposes; ask your health care provider or pharmacist if you have questions. COMMON BRAND NAME(S): Mady Gemma, Cialis What should I tell my care team before I take this medication? They need to know if you have any of these conditions: Abnormal penis shape or Peyronie disease Bleeding disorder Blood diseases, such as sickle cell anemia or leukemia Eye disease, such as retinitis pigmentosa Have had a heart attack Have had a painful and prolonged erection Have had a stroke Heart disease, such as angina, heart failure, irregular heartbeat or rhythm High or low blood pressure Stomach ulcers, other stomach or intestine problems Kidney disease Liver disease An unusual or allergic reaction to tadalafil, other medications, foods, dyes, or preservatives Pregnant or trying to get pregnant Breastfeeding How should I use this medication? Take this medication by mouth with a glass of water. Follow the directions on the prescription label. You may take this medication with or without meals. When this medication is used for erection problems, your care team may prescribe it to be taken once daily or as needed. If you are taking the medication as needed, you may be able to have sexual activity 30 minutes after taking it and for up to 36 hours after taking it. Whether you are taking the medication as needed or once daily, you should not take more than one dose per day. If you are taking this medication for symptoms of benign prostatic hyperplasia (BPH) or to treat both BPH and an erection problem, take the dose once daily at about the same time each day. Do not take  your medication more often than directed. Talk to your care team about the use of this medication in children. Special care may be needed. Overdosage: If you think you have taken too much of this medicine contact a poison control center or emergency room at once. NOTE: This medicine is only for you. Do not share this medicine with others. What if I miss a dose? If you are taking this medication as needed for erection problems, this does not apply. If you miss a dose while taking this medication once daily for an erection problem, benign prostatic hyperplasia, or both, take it as soon as you remember, but do not take more than one dose per day. What may interact with this medication? Do not take this medication with any of the following: Nitrates, such as amyl nitrite, isosorbide dinitrate, isosorbide mononitrate, nitroglycerin Other medications for erectile dysfunction, such as avanafil, sildenafil, vardenafil Other tadalafil products Riociguat Vericiguat This medication may also interact with the following: Alcohol Certain antibiotics, such as clarithromycin or erythromycin Certain antivirals for HIV or hepatitis Certain medications for blood pressure Certain medications for fungal infections, such as fluconazole, itraconazole, ketoconazole, voriconazole Certain medications for seizures, such as carbamazepine, phenytoin, phenobarbital Grapefruit juice Medications for prostate problems Rifabutin, rifampin, or rifapentine Other medications may affect the way this medication works. Talk with your care team about all of the medications you take. They may suggest changes to your treatment plan to lower the risk of side effects and to make sure your medications work as intended. This list may not describe all possible interactions. Give your health care provider a  list of all the medicines, herbs, non-prescription drugs, or dietary supplements you use. Also tell them if you smoke, drink alcohol,  or use illegal drugs. Some items may interact with your medicine. What should I watch for while using this medication? Visit your care team for regular checks on your progress. Tell your care team if your symptoms do not start to get better or if they get worse. Using this medication does not protect you or your partner against HIV or other sexually transmitted infections (STIs). Stop and call your care team right away if you have symptoms such as nausea, dizziness, or chest pain during sex. Contact your care team right away if you have an erection that lasts longer than 4 hours or if it becomes painful. This may be a sign of a serious problem and must be treated right away to prevent permanent damage. What side effects may I notice from receiving this medication? Side effects that you should report to your care team as soon as possible: Allergic reactions--skin rash, itching, hives, swelling of the face, lips, tongue, or throat Hearing loss or ringing in ears Heart attack--pain or tightness in the chest, shoulders, arms, or jaw, nausea, shortness of breath, cold or clammy skin, feeling faint or lightheaded Low blood pressure--dizziness, feeling faint or lightheaded, blurry vision Prolonged or painful erection Redness, blistering, peeling, or loosening of the skin, including inside the mouth Stroke--sudden numbness or weakness of the face, arm, or leg, trouble speaking, confusion, trouble walking, loss of balance or coordination, dizziness, severe headache, change in vision Sudden vision loss in one or both eyes Side effects that usually do not require medical attention (report to your care team if they continue or are bothersome): Back pain Facial flushing or redness Headache Muscle pain Runny or stuffy nose Upset stomach This list may not describe all possible side effects. Call your doctor for medical advice about side effects. You may report side effects to FDA at 1-800-FDA-1088. Where  should I keep my medication? Keep out of the reach of children. Store at room temperature between 15 and 30 degrees C (59 and 86 degrees F). Throw away any unused medication after the expiration date. NOTE: This sheet is a summary. It may not cover all possible information. If you have questions about this medicine, talk to your doctor, pharmacist, or health care provider.  2024 Elsevier/Gold Standard (2022-04-26 00:00:00)

## 2023-06-25 ENCOUNTER — Encounter: Payer: Self-pay | Admitting: Urology

## 2023-06-25 ENCOUNTER — Ambulatory Visit (INDEPENDENT_AMBULATORY_CARE_PROVIDER_SITE_OTHER): Payer: PRIVATE HEALTH INSURANCE | Admitting: Urology

## 2023-06-25 VITALS — BP 137/78 | HR 102 | Ht 72.0 in | Wt 312.0 lb

## 2023-06-25 DIAGNOSIS — Z125 Encounter for screening for malignant neoplasm of prostate: Secondary | ICD-10-CM | POA: Diagnosis not present

## 2023-06-25 DIAGNOSIS — N401 Enlarged prostate with lower urinary tract symptoms: Secondary | ICD-10-CM

## 2023-06-25 DIAGNOSIS — N529 Male erectile dysfunction, unspecified: Secondary | ICD-10-CM

## 2023-06-25 DIAGNOSIS — N3281 Overactive bladder: Secondary | ICD-10-CM | POA: Diagnosis not present

## 2023-06-25 LAB — BLADDER SCAN AMB NON-IMAGING: Scan Result: 23

## 2023-06-25 NOTE — Progress Notes (Signed)
   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  Urinalysis normal, PVR 40 mL, PSA normal at 2.78 June 2023, IPSS score was 8.  He was treated in urgent care for possible prostatitis with Bactrim , but had no significant improvement in lower extremity swelling on that medication.  He also had a ED and we opted for a trial of Cialis  to help improve both his ED and urinary symptoms.  He saw significant improvement in his ED on the daily Cialis , as well as in his urinary symptoms.  For unclear reasons he stopped the Cialis , not surprisingly his urinary symptoms have returned.  He only urinates about 2-3 times during the day, and the main issue is urgency.  Nocturia 0-1 time overnight.  When he was previously on the Flomax  by urgent care he felt that caused severe weakness in his legs.  PVR normal again today at 23ml.  Continue Cialis  5 mg daily, can take boost dose as needed prior to sexual activity RTC 6 months PSA reflex to free prior, PVR   Lawerence Pressman, MD  [facility] [address] [place], [place] [address] [phone]

## 2023-06-25 NOTE — Patient Instructions (Addendum)
 Continue to take the tadalafil (Cialis ) 5 mg every day, as well as an additional dose 1 hour prior to sexual activity if needed.  This medicine helps with both the urination and the erections.  At follow-up we will repeat your PSA blood test which is the blood test that looks for prostate cancer.  It is very important to avoid any sexual activity/ejaculations for at least 3 days prior to that blood test.  Please stop by our [place] lab today during the hours of 8am - 5pm in [address] no appointment needed to have your lab drawn prior to your appointment.

## 2023-06-30 ENCOUNTER — Ambulatory Visit: Admission: EM | Admit: 2023-06-30 | Discharge: 2023-06-30 | Payer: PRIVATE HEALTH INSURANCE

## 2023-06-30 ENCOUNTER — Encounter: Payer: Self-pay | Admitting: Emergency Medicine

## 2023-06-30 DIAGNOSIS — Z77098 Contact with and (suspected) exposure to other hazardous, chiefly nonmedicinal, chemicals: Secondary | ICD-10-CM | POA: Diagnosis not present

## 2023-06-30 NOTE — Discharge Instructions (Addendum)
 Please go to [facility] here in [facility] for evaluation of your eye complaint.
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
## 2023-12-05 ENCOUNTER — Encounter: Payer: Self-pay | Admitting: Urology

## 2023-12-30 ENCOUNTER — Ambulatory Visit: Payer: PRIVATE HEALTH INSURANCE | Admitting: Urology
